# Patient Record
Sex: Male | Born: 1960 | Race: White | Hispanic: No | Marital: Married
Health system: Southern US, Community
[De-identification: ages and names within clinical notes are randomized; demographics above are authoritative.]

---

## 2003-08-27 ENCOUNTER — Encounter: Admission: RE | Admit: 2003-08-27 | Discharge: 2003-08-27 | Payer: Self-pay | Admitting: Orthopaedic Surgery

## 2003-09-12 ENCOUNTER — Inpatient Hospital Stay (HOSPITAL_COMMUNITY): Admission: RE | Admit: 2003-09-12 | Discharge: 2003-09-15 | Payer: Self-pay | Admitting: Orthopaedic Surgery

## 2005-04-24 IMAGING — CR DG CHEST 2V
2 series · 2 of 2 positions shown · non-contrast
Comparison: none

CLINICAL DATA: Pre-op for back surgery. 
 TWO VIEW CHEST
 No prior studies for comparison. 
 Heart size upper limits of normal.   Lungs clear. Osseous structures intact. 
 IMPRESSION 
 No active disease.

[view not recorded (1 of 2)]
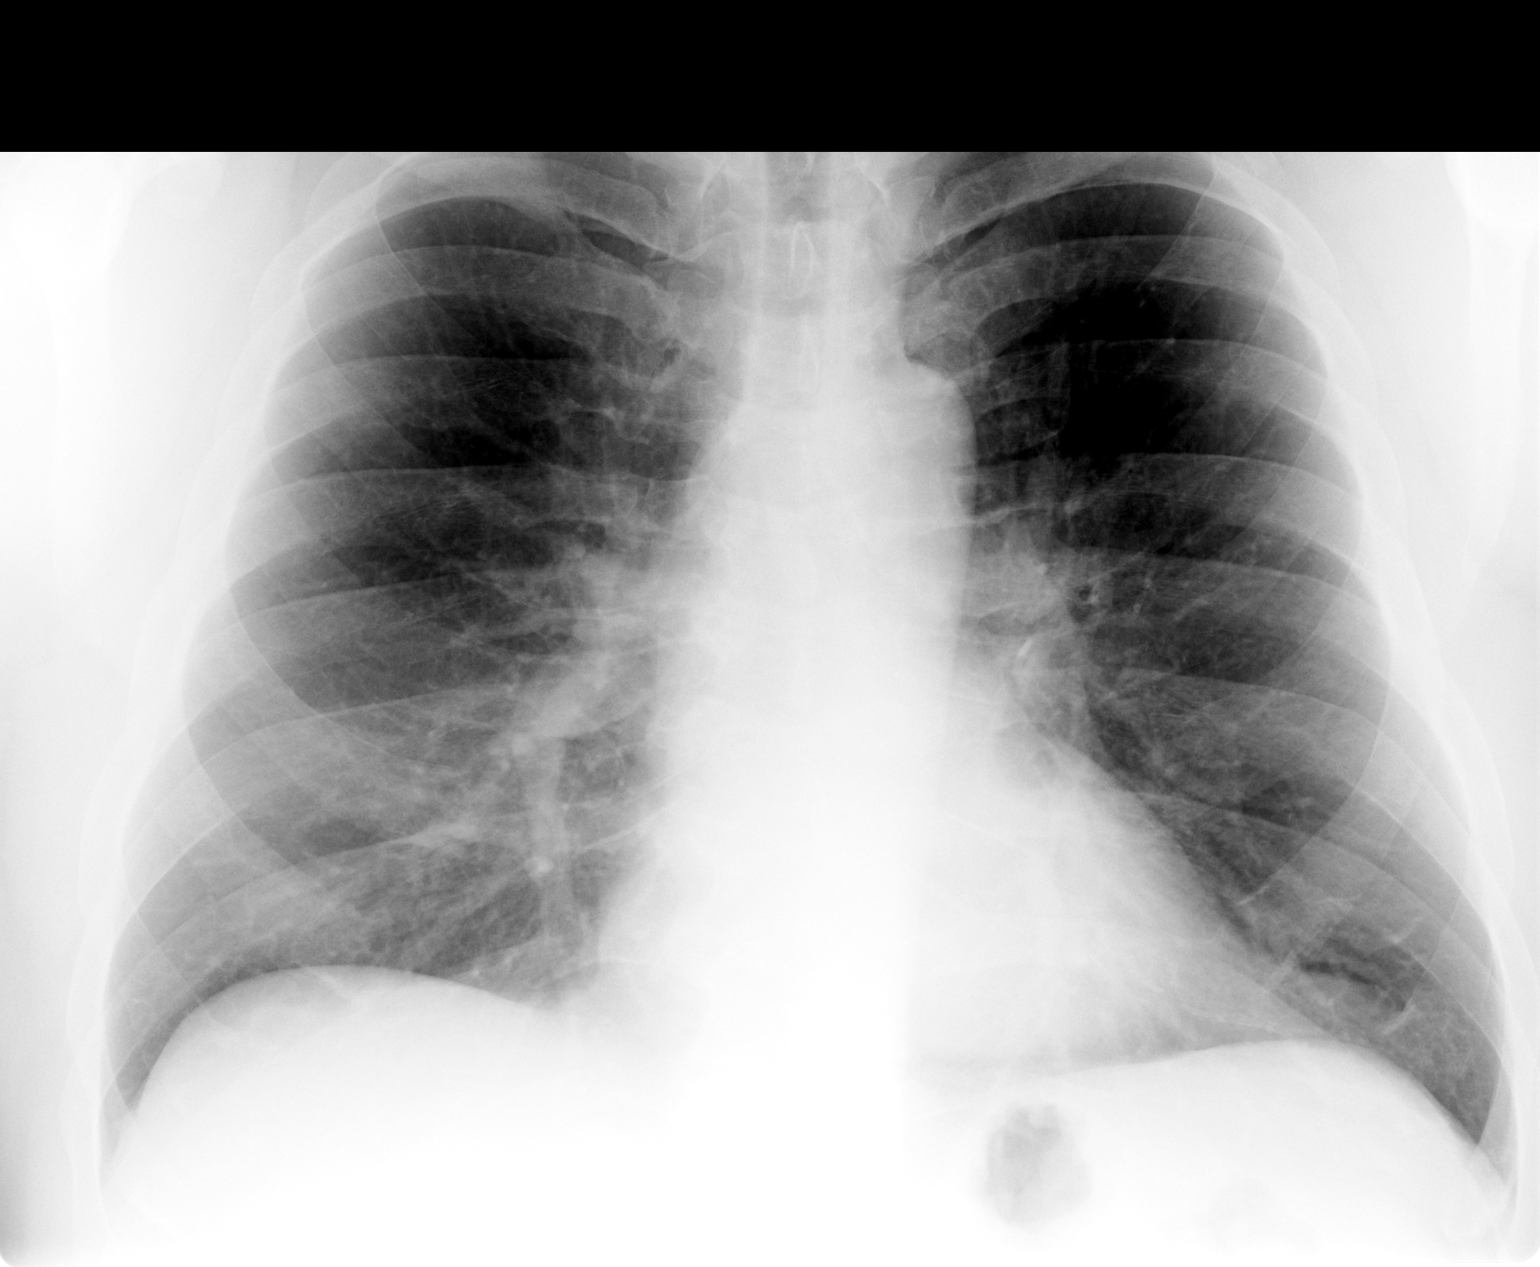

[view not recorded (2 of 2)]
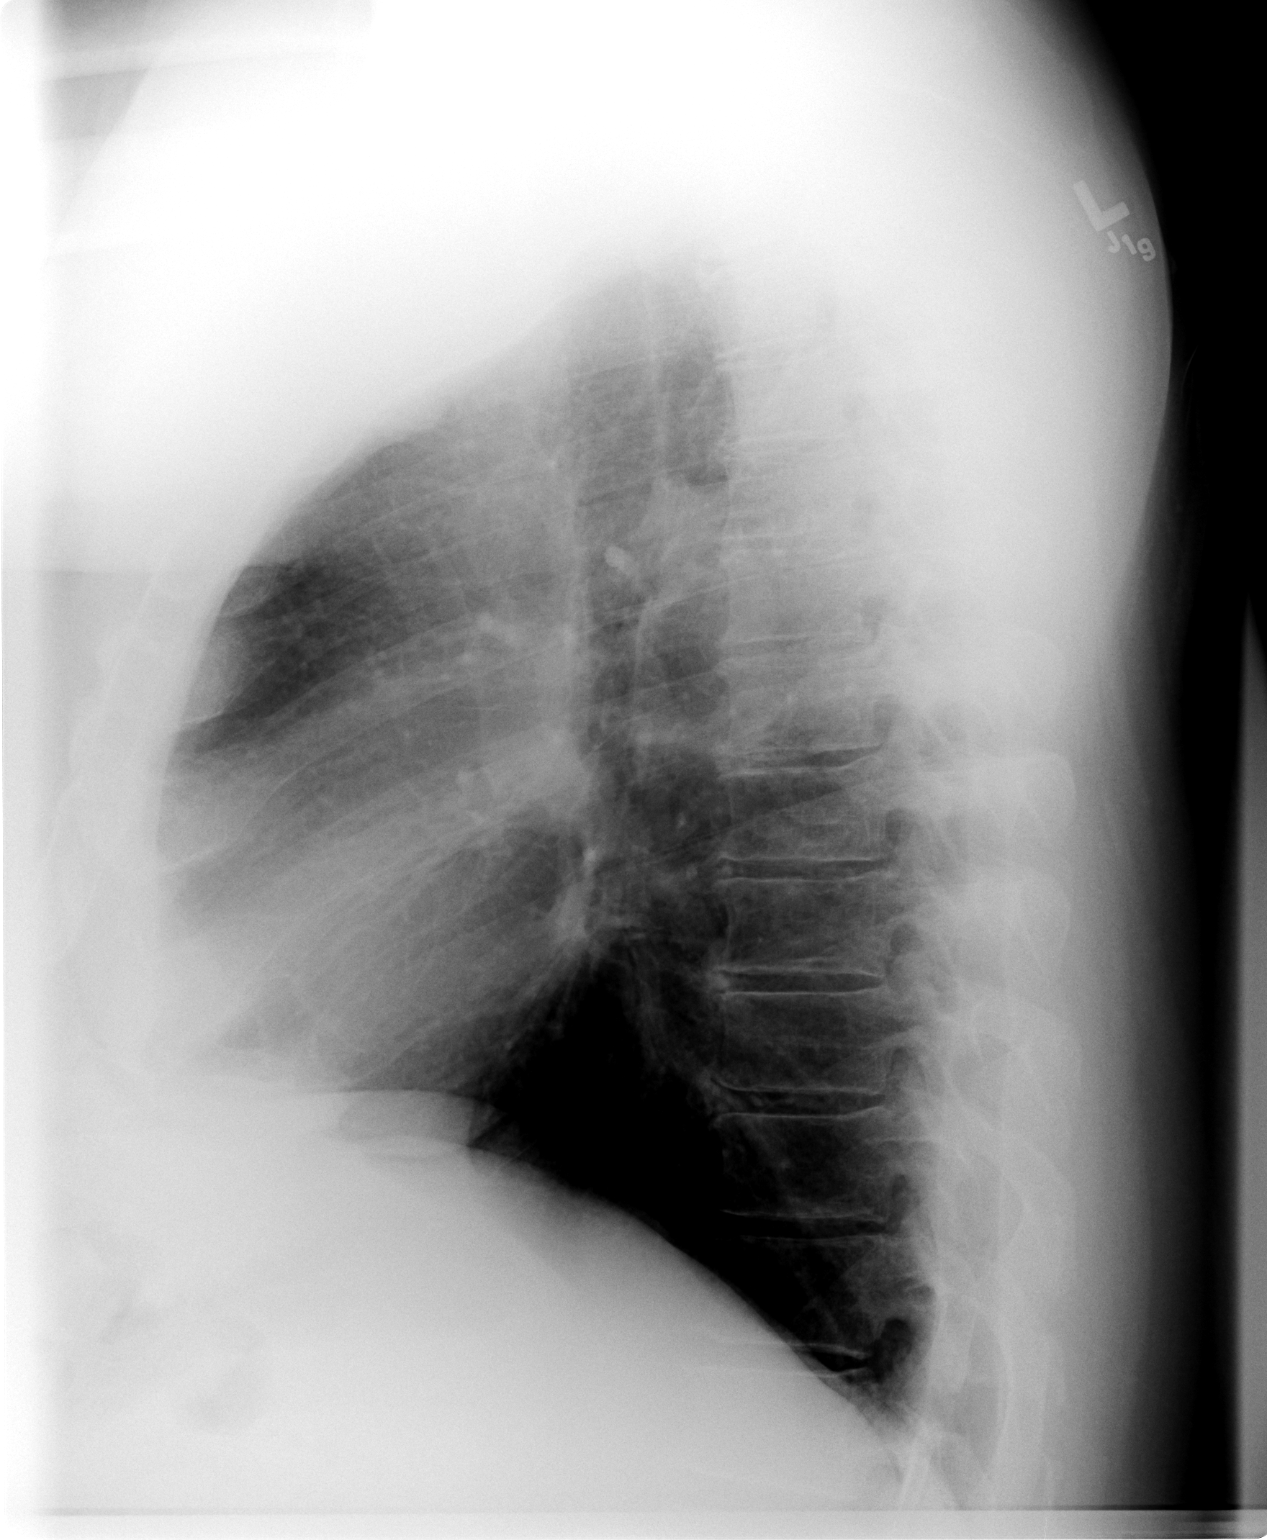

[2 of 2 positions shown; findings below may reference images not displayed]

## 2005-04-25 IMAGING — RF DG C-ARM 61-120 MIN
1 series · 2 of 2 positions shown · non-contrast
Comparison: none

CLINICAL DATA: L3-4 and L4-5 laminectomy revision for degenerative disc disease and spinal stenosis. 
C-ARM FLUOROSCOPY ? 09/12/03
C-ARM fluoroscopy was provided in the operating room. 
IMPRESSION 
C-ARM fluoroscopy provided in the operating room. 
TWO VIEW LUMBAR SPINE ? 09/12/03
PA and lateral film views of the lumbar spine are correlated with the post-diskogram lumbar spine CT from [REDACTED], dated 08/27/03. 
Five non-rib bearing lumbar vertebrae were demonstrated at that time. Pedicle screw and rod fixation is noted at the L3 through L5 levels with interbody spacers in the disc spaces at those levels.  Normal vertebral alignment. Mild to moderate anterior spur formation at multiple levels. 
Fusion at the L3 through L5 levels with normal alignment.

[Series 1: run · 2 of 2 slices shown]
[im 1/2]
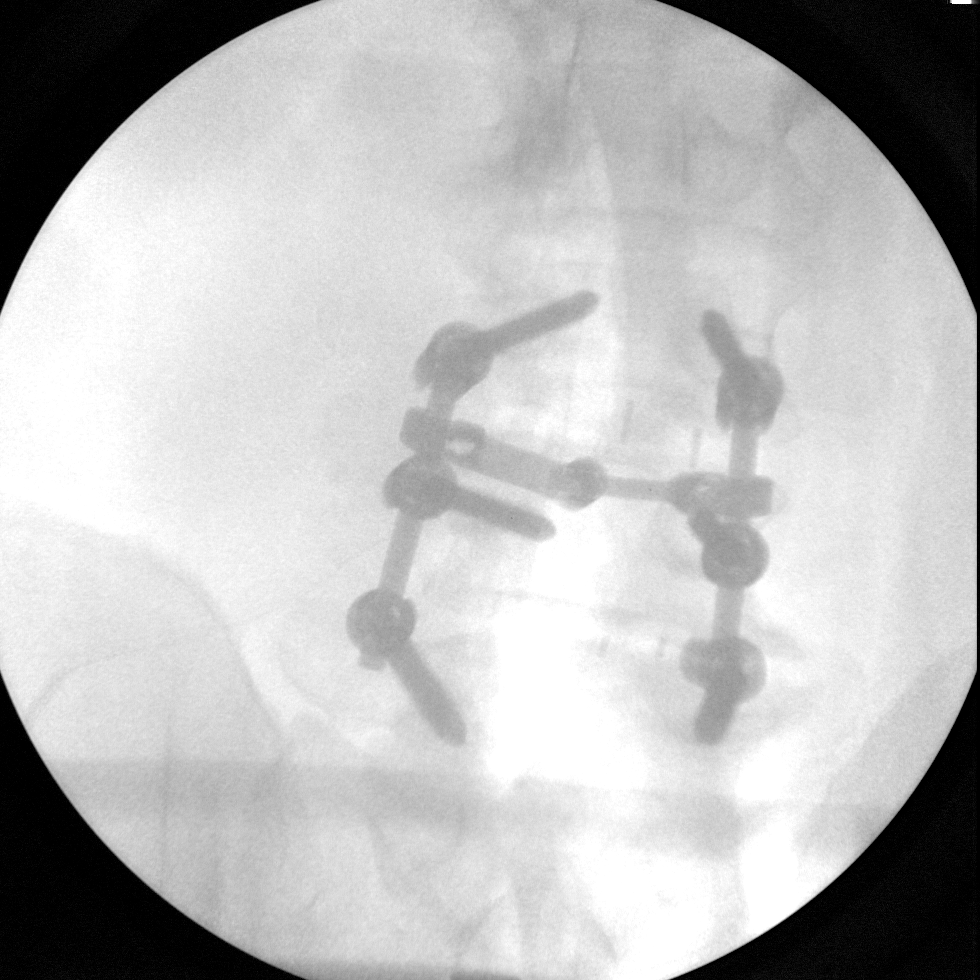
[im 2/2]
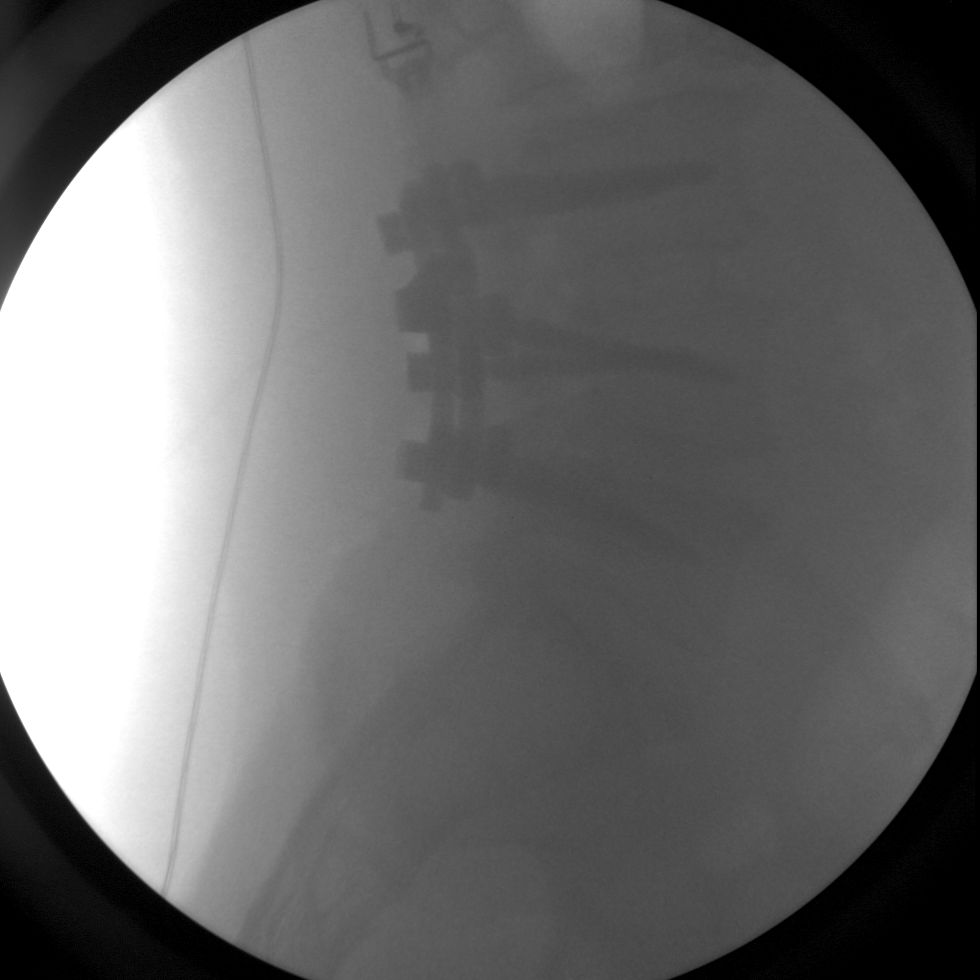

[2 of 2 positions shown; findings below may reference images not displayed]

## 2016-04-23 ENCOUNTER — Other Ambulatory Visit: Payer: Self-pay | Admitting: Orthopaedic Surgery

## 2016-04-23 DIAGNOSIS — M5134 Other intervertebral disc degeneration, thoracic region: Secondary | ICD-10-CM

## 2016-04-23 DIAGNOSIS — M4326 Fusion of spine, lumbar region: Secondary | ICD-10-CM

## 2016-05-06 ENCOUNTER — Ambulatory Visit
Admission: RE | Admit: 2016-05-06 | Discharge: 2016-05-06 | Disposition: A | Discharge status: Home / Self Care | Payer: 59 | Source: Ambulatory Visit | Attending: Orthopaedic Surgery | Admitting: Orthopaedic Surgery

## 2016-05-06 DIAGNOSIS — M5134 Other intervertebral disc degeneration, thoracic region: Secondary | ICD-10-CM

## 2016-05-06 DIAGNOSIS — M4326 Fusion of spine, lumbar region: Secondary | ICD-10-CM

## 2016-05-06 MED ORDER — DIAZEPAM 5 MG PO TABS
10.0000 mg | ORAL_TABLET | Freq: Once | ORAL | Status: AC
  Administered 2016-05-06: 10 mg via ORAL

## 2016-05-06 MED ORDER — ONDANSETRON HCL 4 MG/2ML IJ SOLN
4.0000 mg | Freq: Once | INTRAMUSCULAR | Status: AC
  Administered 2016-05-06: 4 mg via INTRAMUSCULAR

## 2016-05-06 MED ORDER — MEPERIDINE HCL 100 MG/ML IJ SOLN
100.0000 mg | Freq: Once | INTRAMUSCULAR | Status: AC
  Administered 2016-05-06: 100 mg via INTRAMUSCULAR

## 2016-05-06 MED ORDER — IOPAMIDOL (ISOVUE-M 300) INJECTION 61%
10.0000 mL | Freq: Once | INTRAMUSCULAR | Status: AC | PRN
  Administered 2016-05-06: 10 mL via INTRATHECAL

## 2016-05-06 NOTE — Discharge Instructions (Signed)

## 2017-12-18 IMAGING — CT CT T SPINE W/ CM
1 series · 12 of 14 positions shown, 15 images · non-contrast
Comparison: none

CLINICAL DATA: Multiple prior lumbar spine surgeries. Recurrent
left lower extremity L4 and L5 radiculitis.
TECHNIQUE: Contiguous axial images were obtained through the Thoracic and
Lumbar spine after the intrathecal infusion of infusion. Coronal and
sagittal reconstructions were obtained of the axial image sets.

[Series 3: t spine soft · axial · 0.41mm/px · z∈[+907,+1225]mm · 12 of 126 slices shown, 15 images]
[im 10/126  soft-tissue]
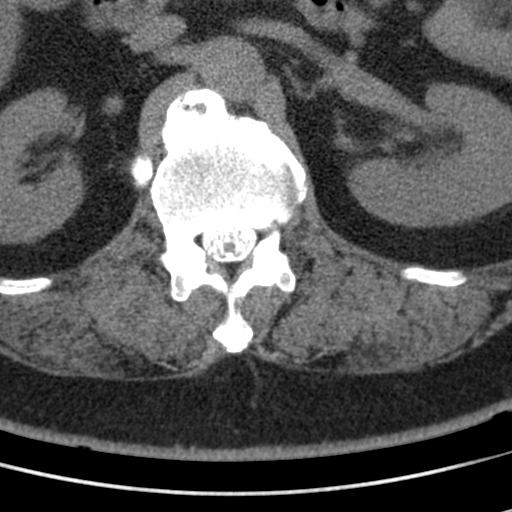
[im 10/126  bone]
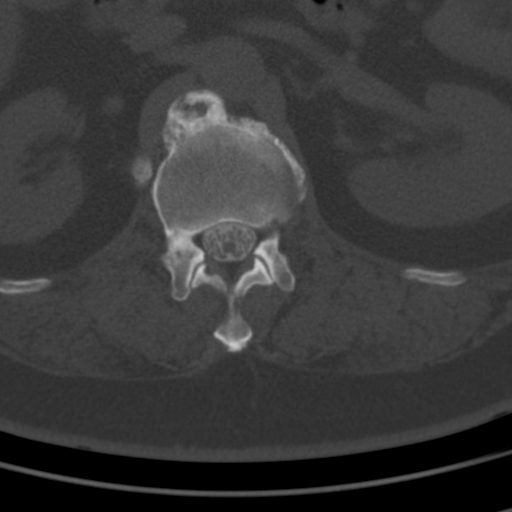
[im 20/126  bone]
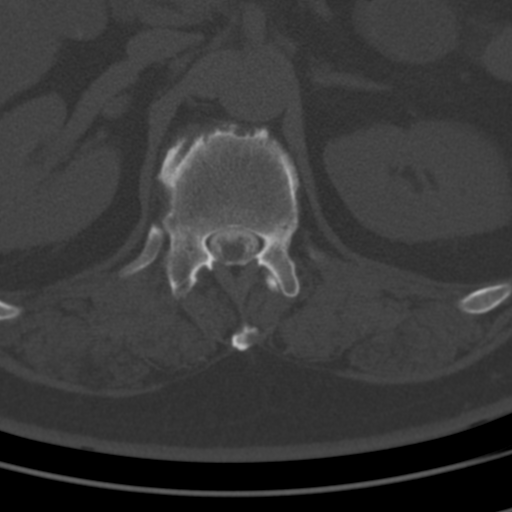
[im 29/126  bone]
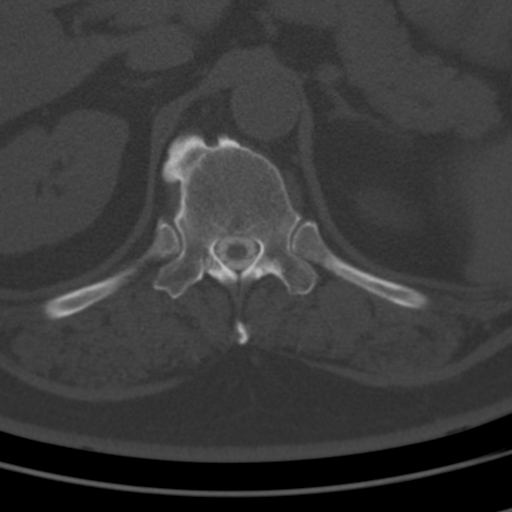
[im 39/126  bone]
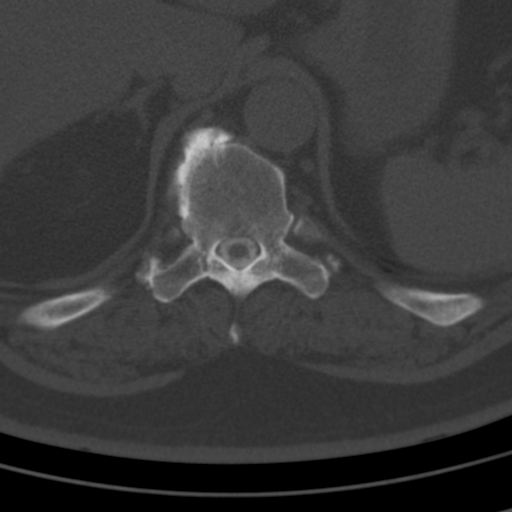
[im 49/126  soft-tissue]
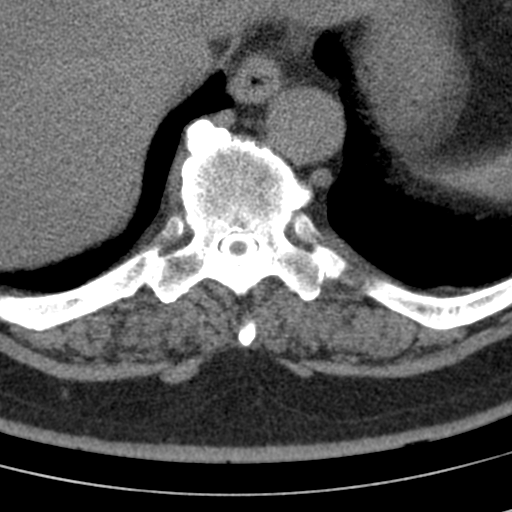
[im 49/126  bone]
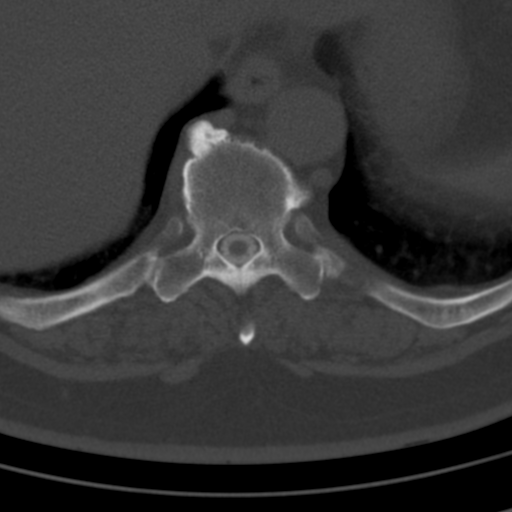
[im 58/126  bone]
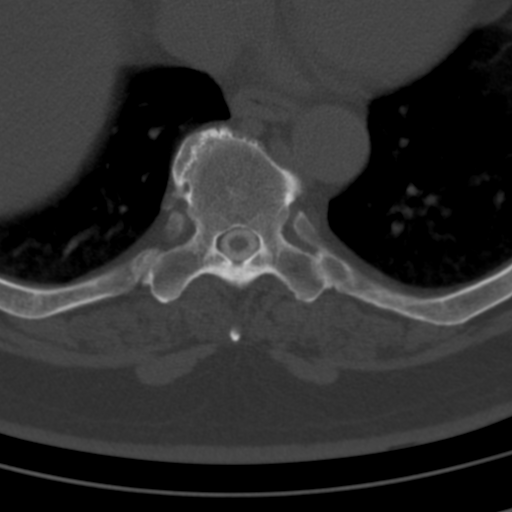
[im 68/126  bone]
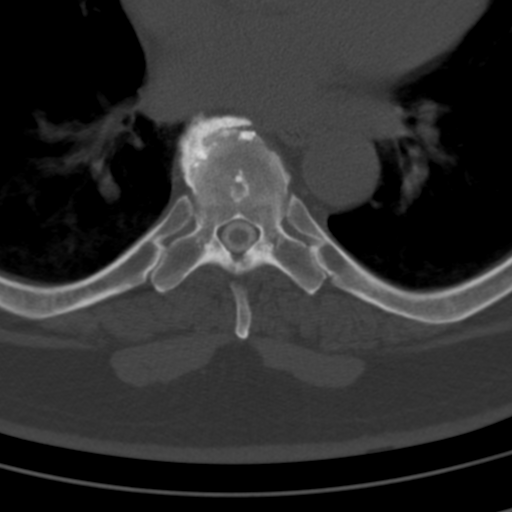
[im 77/126  bone]
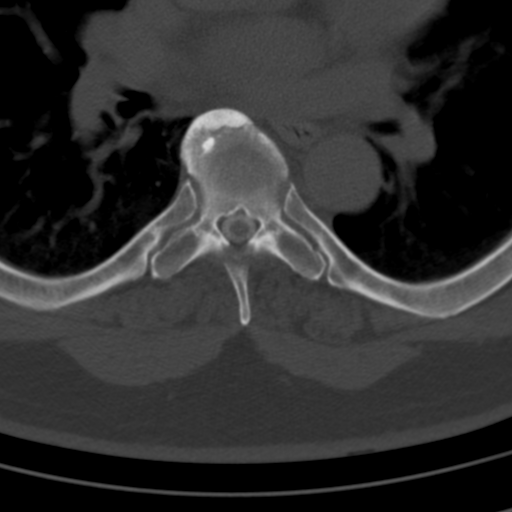
[im 87/126  soft-tissue]
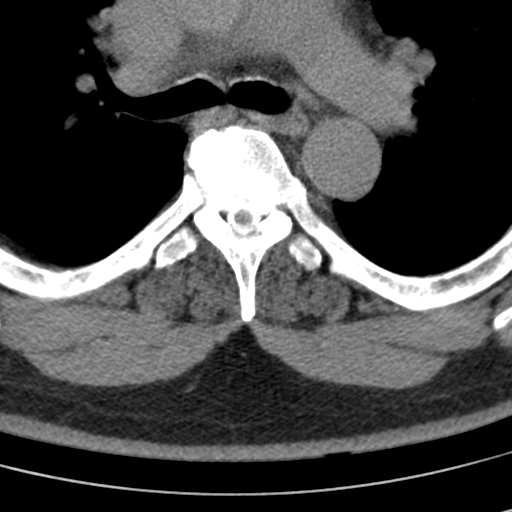
[im 87/126  bone]
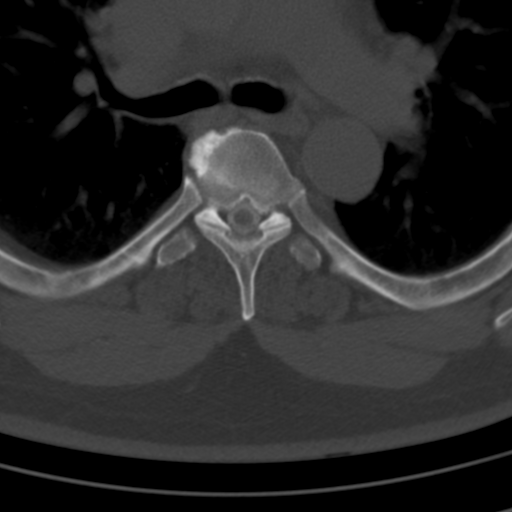
[im 97/126  bone]
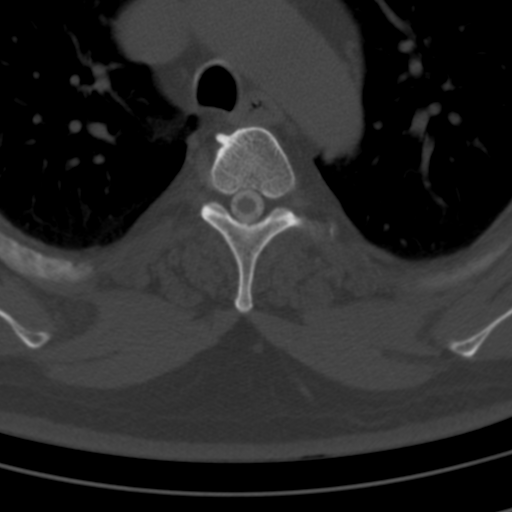
[im 106/126  bone]
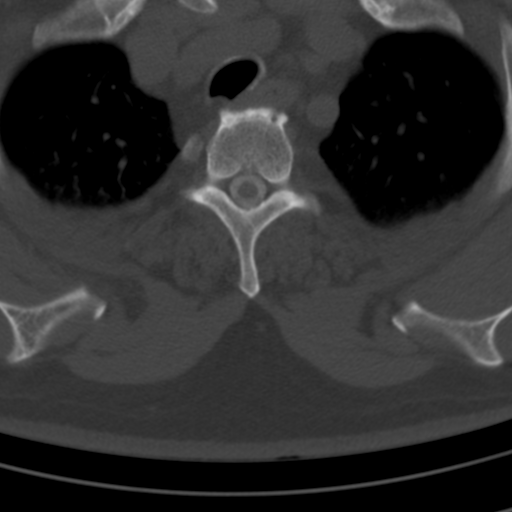
[im 116/126  bone]
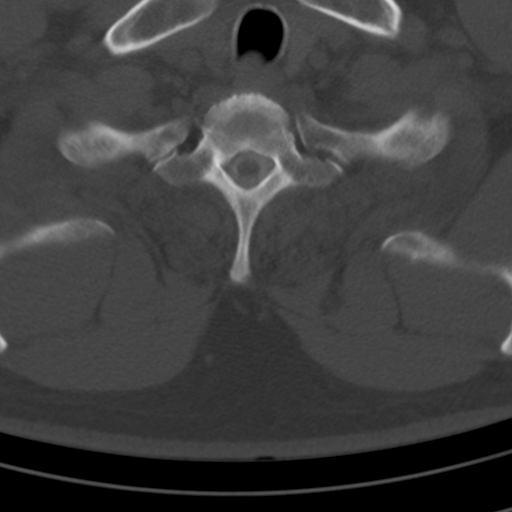

[12 of 14 positions shown; findings below may reference images not displayed]

FLUOROSCOPY TIME:  Radiation Exposure Index (as provided by the
fluoroscopic device): 1338.12 uGy*m2

Fluoroscopy Time:  2 minutes 3 seconds

Number of Acquired Images:  24

PROCEDURE:
LUMBAR PUNCTURE FOR THORACIC AND LUMBAR MYELOGRAM

After thorough discussion of risks and benefits of the procedure
including bleeding, infection, injury to nerves, blood vessels,
adjacent structures as well as headache and CSF leak, written and
oral informed consent was obtained. Consent was obtained by Dr.
John Kwadwo Mistereggen.

Patient was positioned prone on the fluoroscopy table. Local
anesthesia was provided with 1% lidocaine without epinephrine after
prepped and draped in the usual sterile fashion. Puncture was
performed at L1-2 using a 3 1/2 inch 22-gauge spinal needle via left
paramedian approach. Using a single pass through the dura, the
needle was placed within the thecal sac, with return of clear CSF.
10 mL of Isovue-M 300 was injected into the thecal sac, with normal
opacification of the nerve roots and cauda equina consistent with
free flow within the subarachnoid space. The patient was then moved
to the trendelenburg position and contrast flowed into the Thoracic
spine region.

I personally performed the lumbar puncture and administered the
intrathecal contrast. I also personally supervised acquisition of
the myelogram images.
FINDINGS: THORACIC AND LUMBAR MYELOGRAM FINDINGS:

Solid lumbar fusion is evident at L3-4, L4-5, and L5-S1. Bilateral
pedicle screw and rod fixation is intact.

Significant subarticular narrowing is present on the right at L4-5.
Nerve roots within the fused segments otherwise fill normally on
both sides. Grade 1 retrolisthesis is present at the adjacent level,
L2-3. This finding is similar to previous studies back to 6441.
Slight retrolisthesis is also present at L1-2. A chronic remote
inferior endplate fracture is present at L1-2. Rightward curvature
is centered at L2-3 with prominent leftward endplate osteophytes. A
vacuum disc is present at L2-3. Moderate left subarticular narrowing
is present at L2-3. The right L2 nerve root fills normally. No
significant stenosis is evident L1-2. Alignment and vertebral body
heights are maintained throughout the thoracic spine.

There is no significant motion at the L2-3 level with flexion or
extension.

CT THORACIC MYELOGRAM FINDINGS:

Twelve rib-bearing thoracic type vertebral bodies are present.
Schmorl's nodes are present at T6-7, T7-8, and T8-9. Height is
otherwise normal. Alignment is anatomic. The

Uncovertebral spurring at C7-T1 and facet spurring results an
moderate right foraminal stenosis.

Mild foraminal narrowing is worse on the left at at T1-2 and T2-3
due to facet hypertrophy. Mild foraminal narrowing is also present
at T10-11 bilaterally due to facet disease.

Anterior osteophytes are fused T2 through T11 compatible with DISH.
A vacuum disc is present at T11-12. A disc osteophyte complex
partially effaces the ventral CSF at T5-6 and to a lesser extent at
T6-7. A shallow central disc protrusion is present at T8-9. No
significant central canal stenosis is present.

There is some distention of the distal esophagus. The paraspinous
soft tissues are otherwise unremarkable.

CT LUMBAR MYELOGRAM FINDINGS:

Limited imaging the abdomen is unremarkable. No significant
adenopathy is present.

Solid fusion is present across the disc levels at L3-4, L4-5, and
L5-S1.

T12-L1: Mild facet hypertrophy is present bilaterally. There is no
significant stenosis.

L1-2: The a mild broad-based disc protrusion is present. Mild facet
hypertrophy is noted bilaterally. This results in mild bilateral
foraminal narrowing, left greater than right.

L2-3: Laminectomy is present. This decompresses the posterior canal.
A broad-based disc protrusion is present. Moderate facet hypertrophy
is noted bilaterally. This results an moderate left and mild right
subarticular narrowing. Moderate foraminal stenosis is evident
bilaterally.

L3-4: A wide laminectomy is present. The central canal is
decompressed. Mild osseous foraminal narrowing is present
bilaterally.

L4-5: Previously noted area of heterotopic ossification effaces the
right lateral recess and subarticular space. There is mild narrowing
of the left subarticular space. This is not changed significantly.
The foramina are patent. Partial foraminotomy is present on the
right.

L5-S1: Wide laminectomy is noted. The central canal is decompressed.
Minimal right subarticular narrowing is present. Mild osseous
foraminal narrowing is worse on the right.
IMPRESSION: 1. Mild central canal narrowing T5-6 and T6-7.
2. Moderate right foraminal narrowing at C7-T1.
3. Mild osseous foraminal narrowing bilaterally T1-2 and T2-3.
4. Solid intradiscal fusion at L3-4, L4-5, and L5-S1.
5. Mild bilateral foraminal narrowing at L1-2 is worse on the left.
6. Moderate left and mild right subarticular narrowing with moderate
foraminal stenosis bilaterally at L2-3 despite partial laminectomy
at this level. This may account for the patient's left lower
extremity radicular symptoms.
7. Mild osseous foraminal narrowing without significant central
canal stenosis at L3-4.
8. Stable heterotopic opacification effacing the right subarticular
space is stable at L4-5.
9. Minimal right subarticular narrowing at L5-S1 and mild osseous
foraminal narrowing, worse on the right.

## 2022-03-24 ENCOUNTER — Other Ambulatory Visit: Payer: Self-pay | Admitting: Orthopaedic Surgery

## 2022-03-24 DIAGNOSIS — M4326 Fusion of spine, lumbar region: Secondary | ICD-10-CM

## 2022-03-24 DIAGNOSIS — M47894 Other spondylosis, thoracic region: Secondary | ICD-10-CM

## 2022-04-01 ENCOUNTER — Ambulatory Visit
Admission: RE | Admit: 2022-04-01 | Discharge: 2022-04-01 | Disposition: A | Discharge status: Home / Self Care | Payer: Medicare HMO | Source: Ambulatory Visit | Attending: Orthopaedic Surgery | Admitting: Orthopaedic Surgery

## 2022-04-01 DIAGNOSIS — M4326 Fusion of spine, lumbar region: Secondary | ICD-10-CM

## 2022-04-01 DIAGNOSIS — M47894 Other spondylosis, thoracic region: Secondary | ICD-10-CM

## 2022-04-01 MED ORDER — DIAZEPAM 5 MG PO TABS
10.0000 mg | ORAL_TABLET | Freq: Once | ORAL | Status: AC
  Administered 2022-04-01: 10 mg via ORAL

## 2022-04-01 MED ORDER — ONDANSETRON HCL 4 MG/2ML IJ SOLN: 4.0000 mg | Freq: Once | INTRAMUSCULAR | Status: DC | PRN

## 2022-04-01 MED ORDER — IOPAMIDOL (ISOVUE-M 300) INJECTION 61%
10.0000 mL | Freq: Once | INTRAMUSCULAR | Status: AC
  Administered 2022-04-01: 10 mL via INTRATHECAL

## 2022-04-01 MED ORDER — MEPERIDINE HCL 50 MG/ML IJ SOLN: 50.0000 mg | Freq: Once | INTRAMUSCULAR | Status: DC | PRN

## 2022-04-01 NOTE — Discharge Instructions (Signed)
